# Patient Record
Sex: Female | Born: 1960 | Race: Black or African American | Hispanic: No | Marital: Married | Smoking: Never smoker
Health system: Southern US, Community
[De-identification: ages and names within clinical notes are randomized; demographics above are authoritative.]

## PROBLEM LIST (undated history)

## (undated) DIAGNOSIS — I10 Essential (primary) hypertension: Secondary | ICD-10-CM

## (undated) HISTORY — DX: Essential (primary) hypertension: I10

---

## 2005-09-19 ENCOUNTER — Ambulatory Visit (HOSPITAL_COMMUNITY): Admission: RE | Admit: 2005-09-19 | Discharge: 2005-09-19 | Payer: Self-pay | Admitting: *Deleted

## 2005-10-17 ENCOUNTER — Other Ambulatory Visit: Admission: RE | Admit: 2005-10-17 | Discharge: 2005-10-17 | Payer: Self-pay | Admitting: Obstetrics and Gynecology

## 2005-10-19 ENCOUNTER — Encounter: Admission: RE | Admit: 2005-10-19 | Discharge: 2005-10-19 | Payer: Self-pay | Admitting: Obstetrics and Gynecology

## 2005-11-17 ENCOUNTER — Encounter: Admission: RE | Admit: 2005-11-17 | Discharge: 2005-11-17 | Payer: Self-pay | Admitting: Obstetrics and Gynecology

## 2008-07-22 ENCOUNTER — Other Ambulatory Visit: Admission: RE | Admit: 2008-07-22 | Discharge: 2008-07-22 | Payer: Self-pay | Admitting: Family Medicine

## 2010-01-23 ENCOUNTER — Encounter: Payer: Self-pay | Admitting: Obstetrics and Gynecology

## 2010-04-14 ENCOUNTER — Other Ambulatory Visit (HOSPITAL_BASED_OUTPATIENT_CLINIC_OR_DEPARTMENT_OTHER): Payer: Self-pay | Admitting: Family Medicine

## 2010-04-15 ENCOUNTER — Emergency Department (HOSPITAL_BASED_OUTPATIENT_CLINIC_OR_DEPARTMENT_OTHER)
Admission: EM | Admit: 2010-04-15 | Discharge: 2010-04-15 | Disposition: A | Discharge status: Nursing Home (Includes ICF) | Payer: Federal, State, Local not specified - PPO | Source: Home / Self Care | Attending: Emergency Medicine | Admitting: Emergency Medicine

## 2010-04-15 ENCOUNTER — Inpatient Hospital Stay (HOSPITAL_COMMUNITY): Payer: Federal, State, Local not specified - PPO

## 2010-04-15 ENCOUNTER — Inpatient Hospital Stay (HOSPITAL_COMMUNITY)
Admission: AD | Admit: 2010-04-15 | Discharge: 2010-04-17 | DRG: 493 | Disposition: A | Discharge status: Home / Self Care | Payer: Federal, State, Local not specified - PPO | Source: Other Acute Inpatient Hospital | Attending: General Surgery | Admitting: General Surgery

## 2010-04-15 ENCOUNTER — Ambulatory Visit (INDEPENDENT_AMBULATORY_CARE_PROVIDER_SITE_OTHER)
Admission: RE | Admit: 2010-04-15 | Discharge: 2010-04-15 | Disposition: A | Discharge status: Home / Self Care | Payer: Federal, State, Local not specified - PPO | Source: Ambulatory Visit | Attending: Family Medicine | Admitting: Family Medicine

## 2010-04-15 DIAGNOSIS — K810 Acute cholecystitis: Principal | ICD-10-CM | POA: Diagnosis present

## 2010-04-15 DIAGNOSIS — K8010 Calculus of gallbladder with chronic cholecystitis without obstruction: Secondary | ICD-10-CM | POA: Insufficient documentation

## 2010-04-15 DIAGNOSIS — I10 Essential (primary) hypertension: Secondary | ICD-10-CM | POA: Insufficient documentation

## 2010-04-15 DIAGNOSIS — K8020 Calculus of gallbladder without cholecystitis without obstruction: Secondary | ICD-10-CM | POA: Insufficient documentation

## 2010-04-15 DIAGNOSIS — R109 Unspecified abdominal pain: Secondary | ICD-10-CM

## 2010-04-15 DIAGNOSIS — E876 Hypokalemia: Secondary | ICD-10-CM | POA: Diagnosis present

## 2010-04-15 DIAGNOSIS — R748 Abnormal levels of other serum enzymes: Secondary | ICD-10-CM | POA: Insufficient documentation

## 2010-04-15 DIAGNOSIS — R1011 Right upper quadrant pain: Secondary | ICD-10-CM | POA: Insufficient documentation

## 2010-04-15 LAB — CBC
MCHC: 32.9 g/dL (ref 30.0–36.0)
Platelets: 318 10*3/uL (ref 150–400)
RDW: 14.5 % (ref 11.5–15.5)

## 2010-04-15 LAB — COMPREHENSIVE METABOLIC PANEL
ALT: 63 U/L — ABNORMAL HIGH (ref 0–35)
AST: 57 U/L — ABNORMAL HIGH (ref 0–37)
Calcium: 8.5 mg/dL (ref 8.4–10.5)
Creatinine, Ser: 1.1 mg/dL (ref 0.4–1.2)
GFR calc Af Amer: >60 mL/min (ref >60)
Sodium: 141 mEq/L (ref 135–145)
Total Protein: 7.7 g/dL (ref 6.0–8.3)

## 2010-04-15 LAB — DIFFERENTIAL
Basophils Absolute: 0 10*3/uL (ref 0.0–0.1)
Basophils Relative: 0 % (ref 0–1)
Eosinophils Absolute: 0.1 10*3/uL (ref 0.0–0.7)
Eosinophils Relative: 1 % (ref 0–5)
Monocytes Absolute: 1 10*3/uL (ref 0.1–1.0)
Neutro Abs: 7 10*3/uL (ref 1.7–7.7)

## 2010-04-16 ENCOUNTER — Other Ambulatory Visit: Payer: Self-pay | Admitting: General Surgery

## 2010-04-16 ENCOUNTER — Inpatient Hospital Stay (HOSPITAL_COMMUNITY): Payer: Federal, State, Local not specified - PPO

## 2010-04-16 LAB — CBC
HCT: 31.8 % — ABNORMAL LOW (ref 36.0–46.0)
MCH: 26.8 pg (ref 26.0–34.0)
MCV: 82.6 fL (ref 78.0–100.0)
Platelets: 320 10*3/uL (ref 150–400)
RBC: 3.85 MIL/uL — ABNORMAL LOW (ref 3.87–5.11)
WBC: 8.8 10*3/uL (ref 4.0–10.5)

## 2010-04-16 LAB — URINE CULTURE
Colony Count: NO GROWTH
Culture  Setup Time: 201204121820
Culture: NO GROWTH

## 2010-04-16 LAB — COMPREHENSIVE METABOLIC PANEL
ALT: 59 U/L — ABNORMAL HIGH (ref 0–35)
AST: 67 U/L — ABNORMAL HIGH (ref 0–37)
Alkaline Phosphatase: 63 U/L (ref 39–117)
CO2: 25 mEq/L (ref 19–32)
GFR calc Af Amer: >60 mL/min (ref >60)
Glucose, Bld: 147 mg/dL — ABNORMAL HIGH (ref 70–99)
Potassium: 4.2 mEq/L (ref 3.5–5.1)
Sodium: 134 mEq/L — ABNORMAL LOW (ref 135–145)
Total Protein: 5.7 g/dL — ABNORMAL LOW (ref 6.0–8.3)

## 2010-04-16 LAB — MAGNESIUM: Magnesium: 2.5 mg/dL (ref 1.5–2.5)

## 2010-04-29 NOTE — H&P (Signed)
NAMEMERRANDA, Stacy Terry                 ACCOUNT NO.:  [id number]  MEDICAL RECORD NO.:  [id number]           PATIENT TYPE:  I  LOCATION:  5158                         FACILITY:  [facility]  PHYSICIAN:  Adolph Pollack, M.D.DATE OF BIRTH:  22-Jan-1960  DATE OF ADMISSION:  04/15/2010 DATE OF DISCHARGE:                             HISTORY & PHYSICAL   REFERRING PHYSICIAN:  Dr. Preston Fleeting, ER.  PRIMARY CARE:  [facility].  French Ana is her PA.  CHIEF COMPLAINT:  Abdominal pain.  BRIEF HISTORY:  The patient is a 50 year old African American female who developed abdominal pain, nausea and vomiting on Monday.  She had pain throughout the whole day and probably a low grade fever.  She called her primary care and they sent her to the [facility] for evaluation because they were full.  There, she was seen and evaluated initially, treated with Nexium.  The following day, she was seen by her primary care at [facility].  They did blood and urine studies and it was their opinion she probably had cholelithiasis and was sent to [facility] facility for an outpatient ultrasound.  After the findings were obtained, they were called to the primary care and she was sent to the emergency department there at [facility] for evaluation and treatment.  She reports on Tuesday, she had pain, but no further nausea or vomiting.  Wednesday, she also had pain, but no nausea or vomiting.  She had been eating small portions and currently her pain is better unless she push on her abdomen.  Currently, she is feeling no pain at all and she was treated at [facility] with Dilaudid, Zofran. She had one dose of Cipro and one run of IV potassium.  PAST MEDICAL HISTORY: 1. Hypertension. 2. Heart murmur, congenitals.  She thinks it is resolved.  No one said     anything about it recently. 3. Lichen planus.  PAST SURGICAL HISTORY:  None.  FAMILY HISTORY:  Both parents  are deceased.  Father died at 40 with high blood pressure.  Mother died at 41 with alcohol abuse and high blood pressure.  She has 4 brothers; the oldest has diabetes, high blood pressure; the second brother has cancer, hypertension, asthma; third brother has diabetes, hypertension, is on dialysis and has a history of drug abuse; her fourth brother has diabetes and hypertension.  SOCIAL HISTORY:  Tobacco:  None.  Drugs:  None.  Alcohol, she has an occasional glass of wine.  She is a post Electrical engineer.  REVIEW OF SYSTEMS:  GENERAL:  Fevers positive.  Her fever was up at the initial facility today [facility] and on admission here, she is not sure, but thinks she may have had fever prior.  SKIN:  No changes. WEIGHT:  No changes.  PSYCH:  No changes.  CV:  No headaches, dizziness, syncope or stroke.  PULMONARY:  Negative.  CARDIAC:  Negative.  GI: Negative for GERD.  No nausea or vomiting prior to Monday.  No diarrhea, constipation, blood in her stool.  GU:  Negative.  LOWER EXTREMITIES: Negative.  No edema or claudication.  MUSCULOSKELETAL:  No joint changes.  ENDOCRINE:  Negative.  CURRENT MEDICATIONS:  Diovan 80/12.5 one daily.  ALLERGIES:  None known.  PHYSICAL EXAM:  VITAL SIGNS:  At [facility], her temperature on admission was 99.1, respiratory rate was 18, heart rate was 92, blood pressure was 116/72.  Currently, her temperature is 100, heart rate is 83, blood pressure is 117/73, respiratory rate is 18, sats are 100% on room air. GENERAL:  This is a well-nourished, well-developed [place] American female, in no acute distress. HEENT:  Head:  Normocephalic.  Eyes, ears, nose and throat are all within normal limits. NECK:  Trachea is in the midline.  Thyroid is nonpalpable.  No JVD.  No bruits. CHEST:  Clear to auscultation.  Chest was nontender. CARDIAC:  She has a soft 1-2/6 systolic murmur heard both at the aortic and pulmonary positions at the base. ABDOMEN:  Soft.   She is not distended.  She is tender in her right upper quadrant with palpation.  No palpable hepatosplenomegaly.  No hernias, masses or abscesses. GU/RECTAL:  Deferred.  Lymphadenopathy is negative. MUSCULOSKELETAL:  No changes. SKIN:  No changes. NEUROLOGIC:  No changes.  Cranial nerves are grossly normal.  There are no focal changes. PSYCH:  Normal affect.  LABS:  Lipase 146.  CMP shows a sodium of 141, potassium of 4.9, chloride of 97, CO2 of 32, BUN of 12, creatinine of 1.1, total bilirubin is 0.8, alk phos is 97, SGOT is 57, SGPT is 63.  White count is 9000, hemoglobin is 12.1, hematocrit 36.8, platelets are 318,000.  Abdominal ultrasound shows a 4.1 cm stone in the neck of the gallbladder that is 7 mm thickened wall.  She also has a sludge in the gallbladder.  There is mild pericholecystic fluid, bile duct is 6 mm.  Everything else was normal.  IMPRESSION: 1. Cholelithiasis with acute cholecystitis. 2. Hypertension. 3. History of murmur. 4. Hypokalemia.  PLAN:  We are going to reschedule her in the morning.  We will replace her potassium.  She is started on Cipro.  We are going to switch her over to Zosyn.  We will give her magnesium tonight, put her on clear liquids, scheduled for cholecystectomy in the a.m.     Eber Hong, P.A.   ______________________________ Adolph Pollack, M.D.    WDJ/MEDQ  D:  04/15/2010  T:  04/15/2010  Job:  [id number]  cc:   [facility]  Electronically Signed by Sherrie George P.A. on 04/23/2010 09:38:19 PM Electronically Signed by Avel Peace M.D. on 04/29/2010 01:25:08 PM

## 2010-04-29 NOTE — Op Note (Signed)
Stacy Terry, BOSAK                 ACCOUNT NO.:  [id number]  MEDICAL RECORD NO.:  [id number]           PATIENT TYPE:  I  LOCATION:  5158                         FACILITY:  [facility]  PHYSICIAN:  Adolph Pollack, M.D.DATE OF BIRTH:  05/11/1960  DATE OF PROCEDURE:  04/16/2010 DATE OF DISCHARGE:                              OPERATIVE REPORT   PREOPERATIVE DIAGNOSIS:  Acute cholecystitis.  POSTOPERATIVE DIAGNOSIS:  Acute cholecystitis.  PROCEDURE:  Laparoscopic cholecystectomy with cholangiogram.  SURGEON:  Adolph Pollack, M.D.  ASSISTANT:  Brayton El, PA-C  ANESTHESIA:  General.  INDICATIONS:  Ms. Etzkorn is a 50 year old female who presented to [facility] with epigastric and right upper quadrant pain and was discovered to have acute cholecystitis.  She was transferred to [facility] yesterday late afternoon and she was started on IV antibiotics.  She is now brought to the operating room for a laparoscopic cholecystectomy.  Procedure risks and aftercare were discussed with her preoperatively.  TECHNIQUE:  She was brought to the operating room, placed supine on the operating table, and general anesthetic was administered.  Her abdominal wall was then widely sterilely prepped and draped.  In the subumbilical region, Marcaine solution was infiltrated.  A small subumbilical incision was made through the skin, subcutaneous tissue, fascia, and peritoneum entering the peritoneal cavity under direct vision.  A pursestring suture of 0 Vicryl was placed around the fascial edges.  A Hasson trocar was introduced in the peritoneal cavity.  Pneumoperitoneum was created by insufflation of CO2 gas.  Next, a laparoscope was introduced and there was no underlying bleeding or organ injury.  An acutely inflamed distended gallbladder was noted in the right upper quadrant with some omental adhesions.  A 5-mm trocar was placed through epigastric incision and two 5-mm trocars  were placed in the right mid lateral abdomen.  She was placed in a reverse Trendelenburg position, the right side tilted slightly down.  Needle decompression was done of the gallbladder, emptying some thick purulent- appearing bile.  Fundus of the gallbladder was grasped and retracted toward the right shoulder.  Using blunt dissection, I dissected omental adhesions free from the body and infundibulum and then using blunt dissection close to the gallbladder, I mobilized the infundibulum and retracted it laterally.  Using blunt dissection, I identified the cystic duct and created a window around it.  An anterior branch of the cystic artery was also identified.  A window was created around the anterior branch.  The cystic artery was clipped and divided, giving me the critical view.  Following this, I placed a clip to the cystic duct gallbladder junction and made a small incision in the cystic duct.  A cholangiocatheter was passed through the anterior abdominal wall, placed into the cystic duct and a cholangiogram was performed.  A real time fluoroscopy dilute contrast was injected into the cystic duct which was of moderate-to-long length.  There was some small amount of extravasation of contrast around the catheter insertion site.  Common hepatic, right and left hepatic, and common bile ducts all filled. Contrast drained promptly into the duodenum without obvious  evidence of obstruction.  Final reports pending the radiologist interpretation.  Following this, I removed the cholangiocatheter, the cystic duct was clipped 3 times on the biliary side and divided.  I then identified a posterior branch of the cystic artery, clipped and divided close to the gallbladder after creating a window around it.  The gallbladder was then dissected free from liver using electrocautery.  The gallbladder fossa was then copiously irrigated and bleeding point was controlled with electrocautery.  The  gallbladder was placed in an Endopouch bag.  I had to enlarge the subumbilical incision to remove the gallbladder which was removed somewhat piecemeal and I also removed a large stone.  Once the gallbladder was removed, I then replaced the Hasson trocar.  I copiously irrigated out the perihepatic area with sterile saline and evacuated as much of the irrigation fluid as possible.  A piece of Surgicel was then placed in the gallbladder fossa.  I then removed the Hasson trocar and closed the subumbilical fascial defect by tightening up and tightening down the pursestring suture and placing extra 0 Vicryl sutures in the superior aspect of the fascial defect.  I then released the CO2 gas and removed the trocars.  Skin incisions were closed with 4-0 Monocryl subcuticular stitches. Steri-Strips and sterile dressings were applied.  She tolerated the procedure without any apparent complications.  She was taken to recovery room in satisfactory condition.     Adolph Pollack, M.D.     Kari Baars  D:  04/16/2010  T:  04/17/2010  Job:  [id number]  Electronically Signed by Avel Peace M.D. on 04/29/2010 01:25:31 PM

## 2010-05-06 NOTE — Discharge Summary (Signed)
  NAMEANALYNN, DAUM                 ACCOUNT NO.:  [id number]  MEDICAL RECORD NO.:  [id number]           PATIENT TYPE:  I  LOCATION:  5158                         FACILITY:  [facility]  PHYSICIAN:  Adolph Pollack, M.D.DATE OF BIRTH:  [birth year]  DATE OF ADMISSION:  04/15/2010 DATE OF DISCHARGE:  04/17/2010                              DISCHARGE SUMMARY   FINAL DISCHARGE DIAGNOSIS:  Acute cholecystitis.  SECONDARY DIAGNOSIS:  Hypertension.  PROCEDURE:  Laparoscopic cholecystectomy with cholangiogram April 16, 2010.  REASON FOR ADMISSION:  Ms. Sperry is a 51 year old female who presented to the [facility] with epigastric and right upper quadrant pain and was discovered to have acute cholecystitis.  She was subsequently transferred to the [facility], was admitted, and started on IV antibiotics, plans for cholecystectomy.  HOSPITAL COURSE:  She underwent laparoscopic cholecystectomy April 16, 2010 which she tolerated well.  Findings were consistent with acute cholecystitis.  She did well on her first postoperative day, was afebrile that morning, tolerating her soft diet, was voiding, and was able to be discharged.  DISPOSITION:  Discharged to home in satisfactory condition April 16, 2008.  She was given discharge instructions and pain medication and will resume her home medicines.  She will follow up in the office in 2-3 weeks and was told to call if she had any problems.     Adolph Pollack, M.D.     Kari Baars  D:  05/05/2010  T:  05/05/2010  Job:  [id number]  Electronically Signed by Avel Peace M.D. on 05/06/2010 10:01:57 AM

## 2010-11-17 ENCOUNTER — Other Ambulatory Visit: Payer: Self-pay | Admitting: Dermatology

## 2012-01-04 HISTORY — PX: CHOLECYSTECTOMY: SHX55

## 2012-01-23 ENCOUNTER — Other Ambulatory Visit: Payer: Self-pay | Admitting: Family Medicine

## 2012-01-23 DIAGNOSIS — Z1231 Encounter for screening mammogram for malignant neoplasm of breast: Secondary | ICD-10-CM

## 2012-02-22 ENCOUNTER — Ambulatory Visit: Payer: Federal, State, Local not specified - PPO

## 2013-12-11 ENCOUNTER — Other Ambulatory Visit: Payer: Self-pay | Admitting: Family Medicine

## 2013-12-11 DIAGNOSIS — Z1231 Encounter for screening mammogram for malignant neoplasm of breast: Secondary | ICD-10-CM

## 2013-12-25 ENCOUNTER — Encounter (INDEPENDENT_AMBULATORY_CARE_PROVIDER_SITE_OTHER): Payer: Self-pay

## 2013-12-25 ENCOUNTER — Ambulatory Visit
Admission: RE | Admit: 2013-12-25 | Discharge: 2013-12-25 | Disposition: A | Discharge status: Home / Self Care | Payer: Federal, State, Local not specified - PPO | Source: Ambulatory Visit | Attending: Family Medicine | Admitting: Family Medicine

## 2013-12-25 DIAGNOSIS — Z1231 Encounter for screening mammogram for malignant neoplasm of breast: Secondary | ICD-10-CM

## 2014-08-21 ENCOUNTER — Encounter: Payer: Self-pay | Admitting: Podiatry

## 2014-08-21 ENCOUNTER — Ambulatory Visit (INDEPENDENT_AMBULATORY_CARE_PROVIDER_SITE_OTHER): Payer: Federal, State, Local not specified - PPO | Admitting: Podiatry

## 2014-08-21 VITALS — BP 137/79 | HR 51 | Resp 16 | Ht 66.0 in | Wt 152.0 lb

## 2014-08-21 DIAGNOSIS — L608 Other nail disorders: Secondary | ICD-10-CM | POA: Diagnosis not present

## 2014-08-21 DIAGNOSIS — M2042 Other hammer toe(s) (acquired), left foot: Secondary | ICD-10-CM

## 2014-08-21 DIAGNOSIS — B351 Tinea unguium: Secondary | ICD-10-CM | POA: Diagnosis not present

## 2014-08-21 MED ORDER — TERBINAFINE HCL 250 MG PO TABS: 250.0000 mg | ORAL_TABLET | Freq: Every day | ORAL | Status: DC

## 2014-08-21 NOTE — Progress Notes (Signed)
   Subjective:    Patient ID: Stacy Terry, female    DOB: 05/27/1960, 54 y.o.   MRN: [id number]  HPI Patient presents with bilateral nail problem; discoloration; great toes. Right big toe is worse. This has been going on for past 2 months. Pt tried perioxide with no relief.   Review of Systems  Skin: Positive for color change.  Neurological: Positive for tremors.  All other systems reviewed and are negative.      Objective:   Physical Exam        Assessment & Plan:

## 2014-08-24 NOTE — Progress Notes (Signed)
Subjective:     Patient ID: Stacy Terry, female   DOB: [birth year], 54 y.o.   MRN: [id number]  HPI patient presents with nail disease of the hallux bilateral with thickness yellow brittle disease and is concerned about spreading and if anything can be done to help her   Review of Systems  All other systems reviewed and are negative.      Objective:   Physical Exam  Constitutional: She is oriented to person, place, and time.  Cardiovascular: Intact distal pulses.   Musculoskeletal: Normal range of motion.  Neurological: She is oriented to person, place, and time.  Skin: Skin is warm.  Nursing note and vitals reviewed.  neurovascular status is intact with muscle strength adequate range of motion within normal limits. Patient's noted to have nail thickness and yellow brittle discoloration with odor noted of the localized nature and mild infection of the skin plantar that's localized. 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 At this point I have recommended oral terbinafine to be taken for 90 days and we are going to get liver function studies done and I did a biopsy of the nailbed to better understand the underlying pathology. I then discussed formula 3 which was dispensed and the possibility for future laser if symptoms continue

## 2014-09-11 ENCOUNTER — Telehealth: Payer: Self-pay | Admitting: *Deleted

## 2014-09-11 DIAGNOSIS — Z79899 Other long term (current) drug therapy: Secondary | ICD-10-CM

## 2014-09-11 NOTE — Telephone Encounter (Addendum)
Left message requesting pt call concerning lab results.  Dr. Charlsie Merles states will have pt perform Hepatic function test, fungal culture is +, then if blood work is normal may begin Lamisil.  Informed pt of Dr. Beverlee Nims recommendation that if + fungal culture should have hepatic function test.  Pt agreed and orders sent to [facility].

## 2014-10-19 ENCOUNTER — Other Ambulatory Visit: Payer: Self-pay | Admitting: Podiatry

## 2014-10-20 ENCOUNTER — Telehealth: Payer: Self-pay | Admitting: *Deleted

## 2014-10-21 NOTE — Telephone Encounter (Signed)
Entered in error

## 2014-11-21 ENCOUNTER — Encounter: Payer: Self-pay | Admitting: Podiatry

## 2014-12-23 ENCOUNTER — Other Ambulatory Visit: Payer: Self-pay | Admitting: Obstetrics & Gynecology

## 2014-12-23 ENCOUNTER — Other Ambulatory Visit (HOSPITAL_COMMUNITY)
Admission: RE | Admit: 2014-12-23 | Discharge: 2014-12-23 | Disposition: A | Discharge status: Home / Self Care | Payer: Federal, State, Local not specified - PPO | Source: Ambulatory Visit | Attending: Obstetrics & Gynecology | Admitting: Obstetrics & Gynecology

## 2014-12-23 DIAGNOSIS — Z1151 Encounter for screening for human papillomavirus (HPV): Secondary | ICD-10-CM | POA: Diagnosis not present

## 2014-12-23 DIAGNOSIS — Z01419 Encounter for gynecological examination (general) (routine) without abnormal findings: Secondary | ICD-10-CM | POA: Insufficient documentation

## 2014-12-24 LAB — CYTOLOGY - PAP

## 2015-01-09 ENCOUNTER — Ambulatory Visit: Payer: Federal, State, Local not specified - PPO | Admitting: Neurology

## 2015-01-13 ENCOUNTER — Encounter: Payer: Self-pay | Admitting: Neurology

## 2015-01-16 ENCOUNTER — Telehealth: Payer: Self-pay | Admitting: Neurology

## 2015-01-16 ENCOUNTER — Encounter (INDEPENDENT_AMBULATORY_CARE_PROVIDER_SITE_OTHER): Payer: Self-pay

## 2015-01-16 ENCOUNTER — Ambulatory Visit (INDEPENDENT_AMBULATORY_CARE_PROVIDER_SITE_OTHER): Payer: Federal, State, Local not specified - PPO | Admitting: Neurology

## 2015-01-16 ENCOUNTER — Ambulatory Visit (INDEPENDENT_AMBULATORY_CARE_PROVIDER_SITE_OTHER): Payer: Self-pay | Admitting: Neurology

## 2015-01-16 ENCOUNTER — Encounter: Payer: Self-pay | Admitting: Neurology

## 2015-01-16 VITALS — BP 130/83 | HR 76 | Resp 20 | Ht 66.0 in | Wt 145.0 lb

## 2015-01-16 VITALS — BP 130/83 | HR 76 | Ht 66.0 in | Wt 145.2 lb

## 2015-01-16 DIAGNOSIS — G243 Spasmodic torticollis: Secondary | ICD-10-CM | POA: Diagnosis not present

## 2015-01-16 NOTE — Telephone Encounter (Signed)
Met with patient scheduled injection, waiting on botox packet. °

## 2015-01-16 NOTE — Progress Notes (Deleted)
  [facility]    Provider:  Dr Lucia GaskinsAhern Referring Provider: Swazil[place]Jordan, Betty G, MD Primary Care Physician:  SwazilandJORDAN, BETTY G, MD  CC:  ***  HPI:  Stacy FreestoneDawn A Terry is a 55 y.o. female here as a referral from Dr. SwazilandJordan for ***.  ***  Reviewed notes, labs and imaging from outside physicians, which showed ***  Review of Systems: Patient complains of symptoms per HPI as well as the following symptoms ***. Pertinent negatives per HPI. All others negative.   Social History   Social History  . Marital Status: Married    Spouse Name: Baldo AshCarl  . Number of Children: 3  . Years of Education: 12+   Occupational History  . Retired    Social History Main Topics  . Smoking status: Never Smoker   . Smokeless tobacco: Not on file  . Alcohol Use: 0.0 oz/week    0 Standard drinks or equivalent per week     Comment: 2 glasses wine- ocass  . Drug Use: No  . Sexual Activity: Not on file   Other Topics Concern  . Not on file   Social History Narrative   Lives with son   Caffeine use: Green tea- ocass    Family History  Problem Relation Age of Onset  . Hypertension    . Diabetes      Past Medical History  Diagnosis Date  . Hypertension     Past Surgical History  Procedure Laterality Date  . Cholecystectomy  2014    Current Outpatient Prescriptions  Medication Sig Dispense Refill  . valsartan-hydrochlorothiazide (DIOVAN-HCT) 80-12.5 MG tablet TAKE 1 TABLET ONCE A DAY ORALLY 30 DAYS  5  . vitamin B-12 (CYANOCOBALAMIN) 1000 MCG tablet Take 1,000 mcg by mouth daily.    . primidone (MYSOLINE) 50 MG tablet Take 50 mg by mouth. Reported on 01/16/2015  2  . valsartan (DIOVAN) 80 MG tablet Reported on 01/16/2015  5   No current facility-administered medications for this visit.    Allergies as of 01/16/2015  . (No Known Allergies)    Vitals: BP 130/83 mmHg  Pulse 76  Ht 5\' 6"  (1.676 m)  Wt 145 lb 3.2 oz (65.862 kg)  BMI 23.45 kg/m2 Last Weight:  Wt Readings from  Last 1 Encounters:  01/16/15 145 lb 3.2 oz (65.862 kg)   Last Height:   Ht Readings from Last 1 Encounters:  01/16/15 5\' 6"  (1.676 m)         Assessment/Plan:    Naomie DeanAntonia Jorah Hua, MD  [facility] [address] [place], [place] [id number]  Phone [phone] Fax [phone]  A total of *** minutes was spent face-to-face with this patient. Over half this time was spent on counseling patient on the *** diagnosis and different diagnostic and therapeutic options available.

## 2015-01-16 NOTE — Patient Instructions (Addendum)
Cervical dystonia:   FindLeather.com.auhttp://www.mayoclinic.org/diseases-conditions/spasmodic-torticollis/home/ovc-20260698     How does botulism toxin injection Hopp cervical dystonia,  JuniorAgent.[email or web address].botoxcervicaldystonia.com/treatment injur isy

## 2015-01-16 NOTE — Progress Notes (Signed)
PATIENT: Stacy Terry A Whitham DOB: April 05, 1960  Chief Complaint  Patient presents with  . New Patient (Initial Visit)  . Tremors    head tremors, was given primidone but did not take it, wants to discuss wihether it is essential tremors or not, rm 5, alone     HISTORICAL  Stacy Terry is a 55 year old right-handed female, seen in refer by her primary care physician Dr. Betty SwazilandJordan for evaluation of neck achy pain, had shaking, abnormal neck posturing.  She has past medical history of hypertension.  Around age [age], she noticed mild head intermittent shaking, gradually getting worse over the past decade, she now has frequent quick jerking movement of her neck either to the left or to the right, she also feel constant pulling sensation to her left shoulder, and towards her chest, she felt constant neck achy pain, right shoulder tightness,  She recently retired from a desk job, she denied visual difficulty, denied gait difficulty, there was no clear history of neck trauma.  Laboratory in December 17 2014 reviewed, almost CMP, negative RPR, B12 248  Review of SYSTEMS: Full 14 system review of systems performed and notable only for tremor, anxiety  ALLERGIES: No Known Allergies  HOME MEDICATIONS: Current Outpatient Prescriptions  Medication Sig Dispense Refill  . primidone (MYSOLINE) 50 MG tablet Take 50 mg by mouth. Reported on 01/16/2015  2  . valsartan (DIOVAN) 80 MG tablet Reported on 01/16/2015  5  . valsartan-hydrochlorothiazide (DIOVAN-HCT) 80-12.5 MG tablet TAKE 1 TABLET ONCE A DAY ORALLY 30 DAYS  5  . vitamin B-12 (CYANOCOBALAMIN) 1000 MCG tablet Take 1,000 mcg by mouth daily.     No current facility-administered medications for this visit.    PAST MEDICAL HISTORY: Past Medical History  Diagnosis Date  . Hypertension     PAST SURGICAL HISTORY: Past Surgical History  Procedure Laterality Date  . Cholecystectomy  2014    FAMILY HISTORY: Family History  Problem  Relation Age of Onset  . Hypertension    . Diabetes      SOCIAL HISTORY:  Social History   Social History  . Marital Status: Married    Spouse Name: Baldo AshCarl  . Number of Children: 3  . Years of Education: 12+   Occupational History  . Retired    Social History Main Topics  . Smoking status: Never Smoker   . Smokeless tobacco: Not on file  . Alcohol Use: 0.0 oz/week    0 Standard drinks or equivalent per week     Comment: 2 glasses wine- ocass  . Drug Use: No  . Sexual Activity: Not on file   Other Topics Concern  . Not on file   Social History Narrative   Lives with son   Caffeine use: Green tea- ocass     PHYSICAL EXAM   Filed Vitals:   01/16/15 1236  BP: 130/83  Pulse: 76  Resp: 20  Height: 5\' 6"  (1.676 m)  Weight: 145 lb (65.772 kg)    Not recorded      Body mass index is 23.41 kg/(m^2).  PHYSICAL EXAMNIATION:  Gen: NAD, conversant, well nourised, obese, well groomed                     Cardiovascular: Regular rate rhythm, no peripheral edema, warm, nontender. Eyes: Conjunctivae clear without exudates or hemorrhage Neck: Supple, no carotid bruise. Pulmonary: Clear to auscultation bilaterally   NEUROLOGICAL EXAM:  MENTAL STATUS: Speech:    Speech is  normal; fluent and spontaneous with normal comprehension.  Cognition:     Orientation to time, place and person     Normal recent and remote memory     Normal Attention span and concentration     Normal Language, naming, repeating,spontaneous speech     Fund of knowledge   CRANIAL NERVES: CN II: Visual fields are full to confrontation. Fundoscopic exam is normal with sharp discs and no vascular changes. Pupils are round equal and briskly reactive to light. CN III, IV, VI: extraocular movement are normal. No ptosis. CN V: Facial sensation is intact to pinprick in all 3 divisions bilaterally. Corneal responses are intact.  CN VII: Face is symmetric with normal eye closure and smile. CN VIII:  Hearing is normal to rubbing fingers CN IX, X: Palate elevates symmetrically. Phonation is normal. CN XI: Head turning and shoulder shrug are intact CN XII: Tongue is midline with normal movements and no atrophy.  MOTOR: She has moderate right shoulder elevation, mild anterocollis, moderate left tilt, moderate right turn, occasionally no-no head shaking, full range of motion There is no pronator drift of out-stretched arms. Muscle bulk and tone are normal. Muscle strength is normal.  REFLEXES: Reflexes are 2+ and symmetric at the biceps, triceps, knees, and ankles. Plantar responses are flexor.  SENSORY: Intact to light touch, pinprick, position sense, and vibration sense are intact in fingers and toes.  COORDINATION: Rapid alternating movements and fine finger movements are intact. There is no dysmetria on finger-to-nose and heel-knee-shin.    GAIT/STANCE: Posture is normal. Gait is steady with normal steps, base, arm swing, and turning. Heel and toe walking are normal. Tandem gait is normal.  Romberg is absent.   DIAGNOSTIC DATA (LABS, IMAGING, TESTING) - I reviewed patient records, labs, notes, testing and imaging myself where available.   ASSESSMENT AND PLAN  Stacy Terry is a 55 y.o. female    Cervical dystonia  She has moderate anterocollis, right shoulder elevation, left tilt, right turn, occasionally no-no head shaking, good range of motion  Complete evaluation with MRI of cervical spine  Preauthorization process for xeomin  Return to clinic for EMG guided xeomin injection  Also provide information for neck stretching exercise, hot compression of her neck and shoulder   Levert Feinstein, M.D. Ph.D.  [facility] [address] [place], [place] [address] Ph: [phone] Fax: [phone]  CC: Dr. Betty Swaziland

## 2015-01-18 NOTE — Progress Notes (Signed)
Left without being seen.

## 2015-02-25 ENCOUNTER — Telehealth: Payer: Self-pay | Admitting: Neurology

## 2015-02-25 NOTE — Telephone Encounter (Signed)
Called patient to inform her that the pharmacy is waiting for her consent to ship medication. She needs to call [phone].

## 2015-02-26 ENCOUNTER — Encounter: Payer: Self-pay | Admitting: Neurology

## 2015-02-26 ENCOUNTER — Ambulatory Visit (INDEPENDENT_AMBULATORY_CARE_PROVIDER_SITE_OTHER): Payer: Federal, State, Local not specified - PPO | Admitting: Neurology

## 2015-02-26 VITALS — BP 112/74 | HR 58 | Ht 66.0 in | Wt 145.0 lb

## 2015-02-26 DIAGNOSIS — F419 Anxiety disorder, unspecified: Secondary | ICD-10-CM | POA: Insufficient documentation

## 2015-02-26 MED ORDER — VENLAFAXINE HCL ER 37.5 MG PO CP24: 37.5000 mg | ORAL_CAPSULE | Freq: Every day | ORAL | Status: AC

## 2015-02-26 NOTE — Progress Notes (Signed)
Chief Complaint  Patient presents with  . Cervical Dystonia    She has been stretching and doing yoga.  She would like to further discuss treatment with Xeomin.      PATIENT: Stacy Terry DOB: 1960-10-25  Chief Complaint  Patient presents with  . Cervical Dystonia    She has been stretching and doing yoga.  She would like to further discuss treatment with Xeomin.     HISTORICAL  Stacy Terry is a 55 year old right-handed female, seen in refer by her primary care physician Dr. Betty Swaziland for evaluation of neck achy pain, had shaking, abnormal neck posturing.  She has past medical history of hypertension.  Around age [age], she noticed mild head intermittent shaking, gradually getting worse over the past decade, she now has frequent quick jerking movement of her neck either to the left or to the right, she also feel constant pulling sensation to her left shoulder, and towards her chest, she felt constant neck achy pain, right shoulder tightness,  She recently retired from a desk job, she denied visual difficulty, denied gait difficulty, there was no clear history of neck trauma.  Laboratory in December 17 2014 reviewed, almost CMP, negative RPR, B12 [id number]  Update February 26 2015: Patient is approved for EMG guided xeomin injection for her cervical dystonia, however she has concern about her diagnosis, and potential side effect with filmy injection, she also complains of anxiety symptoms, wants to have further understanding of her condition before proceed with EMG guided injection.  Review of SYSTEMS: Full 14 system review of systems performed and notable only for tremor, anxiety  ALLERGIES: No Known Allergies  HOME MEDICATIONS: Current Outpatient Prescriptions  Medication Sig Dispense Refill  . valsartan-hydrochlorothiazide (DIOVAN-HCT) 80-12.5 MG tablet TAKE 1 TABLET ONCE A DAY ORALLY 30 DAYS  5  . vitamin B-12 (CYANOCOBALAMIN) 1000 MCG tablet Take 1,000 mcg by mouth  daily.     No current facility-administered medications for this visit.    PAST MEDICAL HISTORY: Past Medical History  Diagnosis Date  . Hypertension     PAST SURGICAL HISTORY: Past Surgical History  Procedure Laterality Date  . Cholecystectomy  2014    FAMILY HISTORY: Family History  Problem Relation Age of Onset  . Hypertension    . Diabetes      SOCIAL HISTORY:  Social History   Social History  . Marital Status: Married    Spouse Name: Baldo Ash  . Number of Children: 3  . Years of Education: 12+   Occupational History  . Retired    Social History Main Topics  . Smoking status: Never Smoker   . Smokeless tobacco: Not on file  . Alcohol Use: 0.0 oz/week    0 Standard drinks or equivalent per week     Comment: 2 glasses wine- ocass  . Drug Use: No  . Sexual Activity: Not on file   Other Topics Concern  . Not on file   Social History Narrative   Lives with son   Caffeine use: Green tea- ocass     PHYSICAL EXAM   Filed Vitals:   02/26/15 0802  BP: 112/74  Pulse: 58  Height:  (1.676 m)  Weight: 145 lb (65.772 kg)    Not recorded      Body mass index is 23.41 kg/(m^2).  PHYSICAL EXAMNIATION:  Gen: NAD, conversant, well nourised, obese, well groomed  Cardiovascular: Regular rate rhythm, no peripheral edema, warm, nontender. Eyes: Conjunctivae clear without exudates or hemorrhage Neck: Supple, no carotid bruise. Pulmonary: Clear to auscultation bilaterally   NEUROLOGICAL EXAM:  MENTAL STATUS: Speech:    Speech is normal; fluent and spontaneous with normal comprehension.  Cognition:     Orientation to time, place and person     Normal recent and remote memory     Normal Attention span and concentration     Normal Language, naming, repeating,spontaneous speech     Fund of knowledge   CRANIAL NERVES: CN II: Visual fields are full to confrontation. Fundoscopic exam is normal with sharp discs and no vascular changes.  Pupils are round equal and briskly reactive to light. CN III, IV, VI: extraocular movement are normal. No ptosis. CN V: Facial sensation is intact to pinprick in all 3 divisions bilaterally. Corneal responses are intact.  CN VII: Face is symmetric with normal eye closure and smile. CN VIII: Hearing is normal to rubbing fingers CN IX, X: Palate elevates symmetrically. Phonation is normal. CN XI: Head turning and shoulder shrug are intact CN XII: Tongue is midline with normal movements and no atrophy.  MOTOR: She has moderate right shoulder elevation, mild anterocollis, moderate left tilt, moderate right turn, occasionally no-no head shaking, full range of motion There is no pronator drift of out-stretched arms. Muscle bulk and tone are normal. Muscle strength is normal.  REFLEXES: Reflexes are 2+ and symmetric at the biceps, triceps, knees, and ankles. Plantar responses are flexor.  SENSORY: Intact to light touch, pinprick, position sense, and vibration sense are intact in fingers and toes.  COORDINATION: Rapid alternating movements and fine finger movements are intact. There is no dysmetria on finger-to-nose and heel-knee-shin.    GAIT/STANCE: Posture is normal. Gait is steady with normal steps, base, arm swing, and turning. Heel and toe walking are normal. Tandem gait is normal.  Romberg is absent.   DIAGNOSTIC DATA (LABS, IMAGING, TESTING) - I reviewed patient records, labs, notes, testing and imaging myself where available.   ASSESSMENT AND PLAN  Stacy Terry is a 55 y.o. female    Cervical dystonia  She has moderate anterocollis, right shoulder elevation, left tilt, right turn, occasionally no-no head shaking, good range of motion  Complete evaluation with MRI of cervical spine  I have reviewed her diagnosis, mechanism of action of botulism toxin, potential side effect  She will return for EMG guided xeomin injection.  Face to face time was 25 minutes, greater than 50%  of the time was spent in counseling and coordination of care with the patient   Levert Feinstein, M.D. Ph.D.  [facility] [address] [place], [place] [address] Ph: [phone] Fax: [phone]  CC: Dr. Betty Swaziland

## 2015-03-02 ENCOUNTER — Ambulatory Visit: Payer: Federal, State, Local not specified - PPO | Admitting: Neurology

## 2015-03-18 ENCOUNTER — Ambulatory Visit (INDEPENDENT_AMBULATORY_CARE_PROVIDER_SITE_OTHER): Payer: Federal, State, Local not specified - PPO

## 2015-03-18 DIAGNOSIS — G243 Spasmodic torticollis: Secondary | ICD-10-CM | POA: Diagnosis not present

## 2015-12-14 ENCOUNTER — Other Ambulatory Visit: Payer: Self-pay | Admitting: Family Medicine

## 2015-12-14 DIAGNOSIS — Z1231 Encounter for screening mammogram for malignant neoplasm of breast: Secondary | ICD-10-CM

## 2016-08-25 DIAGNOSIS — K080 Exfoliation of teeth due to systemic causes: Secondary | ICD-10-CM | POA: Diagnosis not present

## 2017-05-30 ENCOUNTER — Other Ambulatory Visit: Payer: Self-pay | Admitting: Family Medicine

## 2017-05-30 DIAGNOSIS — Z1231 Encounter for screening mammogram for malignant neoplasm of breast: Secondary | ICD-10-CM

## 2017-05-31 ENCOUNTER — Ambulatory Visit
Admission: RE | Admit: 2017-05-31 | Discharge: 2017-05-31 | Disposition: A | Discharge status: Home / Self Care | Payer: Federal, State, Local not specified - PPO | Source: Ambulatory Visit | Attending: Family Medicine | Admitting: Family Medicine

## 2017-05-31 DIAGNOSIS — Z1231 Encounter for screening mammogram for malignant neoplasm of breast: Secondary | ICD-10-CM

## 2017-09-14 DIAGNOSIS — R7303 Prediabetes: Secondary | ICD-10-CM | POA: Diagnosis not present

## 2017-09-14 DIAGNOSIS — M7662 Achilles tendinitis, left leg: Secondary | ICD-10-CM | POA: Diagnosis not present

## 2017-09-14 DIAGNOSIS — I10 Essential (primary) hypertension: Secondary | ICD-10-CM | POA: Diagnosis not present

## 2017-09-14 DIAGNOSIS — F411 Generalized anxiety disorder: Secondary | ICD-10-CM | POA: Diagnosis not present

## 2017-09-20 DIAGNOSIS — K080 Exfoliation of teeth due to systemic causes: Secondary | ICD-10-CM | POA: Diagnosis not present

## 2017-10-10 DIAGNOSIS — Z01419 Encounter for gynecological examination (general) (routine) without abnormal findings: Secondary | ICD-10-CM | POA: Diagnosis not present

## 2018-03-16 DIAGNOSIS — R7303 Prediabetes: Secondary | ICD-10-CM | POA: Diagnosis not present

## 2018-03-16 DIAGNOSIS — F411 Generalized anxiety disorder: Secondary | ICD-10-CM | POA: Diagnosis not present

## 2018-03-16 DIAGNOSIS — I10 Essential (primary) hypertension: Secondary | ICD-10-CM | POA: Diagnosis not present

## 2018-06-25 DIAGNOSIS — K080 Exfoliation of teeth due to systemic causes: Secondary | ICD-10-CM | POA: Diagnosis not present

## 2018-10-12 DIAGNOSIS — Z01419 Encounter for gynecological examination (general) (routine) without abnormal findings: Secondary | ICD-10-CM | POA: Diagnosis not present

## 2018-10-18 DIAGNOSIS — Z13220 Encounter for screening for lipoid disorders: Secondary | ICD-10-CM | POA: Diagnosis not present

## 2018-10-18 DIAGNOSIS — R7303 Prediabetes: Secondary | ICD-10-CM | POA: Diagnosis not present

## 2018-10-18 DIAGNOSIS — I10 Essential (primary) hypertension: Secondary | ICD-10-CM | POA: Diagnosis not present

## 2018-10-18 DIAGNOSIS — Z0000 Encounter for general adult medical examination without abnormal findings: Secondary | ICD-10-CM | POA: Diagnosis not present

## 2018-10-18 DIAGNOSIS — Z131 Encounter for screening for diabetes mellitus: Secondary | ICD-10-CM | POA: Diagnosis not present

## 2019-04-04 ENCOUNTER — Ambulatory Visit: Payer: Federal, State, Local not specified - PPO | Attending: Internal Medicine

## 2019-04-04 DIAGNOSIS — Z23 Encounter for immunization: Secondary | ICD-10-CM

## 2019-04-04 NOTE — Progress Notes (Signed)
   Covid-19 Vaccination Clinic  Name:  LYRIC ROSSANO    MRN: [id number] DOB: 1960-02-08  04/04/2019  Ms. Melkonian was observed post Covid-19 immunization for 15 minutes without incident. She was provided with Vaccine Information Sheet and instruction to access the V-Safe system.   Ms. Novella was instructed to call 911 with any severe reactions post vaccine: Marland Kitchen Difficulty breathing  . Swelling of face and throat  . A fast heartbeat  . A bad rash all over body  . Dizziness and weakness   Immunizations Administered    Name Date Dose VIS Date Route   Pfizer COVID-19 Vaccine 04/04/2019  9:33 AM 0.3 mL 12/14/2018 Intramuscular   Manufacturer: [employer], [employer]   Lot: [id number]   NDC: [address]-4

## 2019-04-29 ENCOUNTER — Ambulatory Visit: Payer: Federal, State, Local not specified - PPO | Attending: Internal Medicine

## 2019-04-29 DIAGNOSIS — Z23 Encounter for immunization: Secondary | ICD-10-CM

## 2019-04-29 NOTE — Progress Notes (Signed)
   Covid-19 Vaccination Clinic  Name:  AMA MCMASTER    MRN: [id number] DOB: 01-04-1960  04/29/2019  Ms. Shuster was observed post Covid-19 immunization for 30 minutes based on pre-vaccination screening without incident. She was provided with Vaccine Information Sheet and instruction to access the V-Safe system.   Ms. Culliton was instructed to call 911 with any severe reactions post vaccine: Marland Kitchen Difficulty breathing  . Swelling of face and throat  . A fast heartbeat  . A bad rash all over body  . Dizziness and weakness   Immunizations Administered    Name Date Dose VIS Date Route   Pfizer COVID-19 Vaccine 04/29/2019  9:46 AM 0.3 mL 02/27/2018 Intramuscular   Manufacturer: [employer], [employer]   Lot: [id number]   NDC: [address]-6

## 2019-08-12 DIAGNOSIS — Z20822 Contact with and (suspected) exposure to covid-19: Secondary | ICD-10-CM | POA: Diagnosis not present

## 2019-08-16 DIAGNOSIS — Z9189 Other specified personal risk factors, not elsewhere classified: Secondary | ICD-10-CM | POA: Diagnosis not present

## 2019-08-16 DIAGNOSIS — U071 COVID-19: Secondary | ICD-10-CM | POA: Diagnosis not present

## 2019-08-23 DIAGNOSIS — R0602 Shortness of breath: Secondary | ICD-10-CM | POA: Diagnosis not present

## 2019-08-23 DIAGNOSIS — Z03818 Encounter for observation for suspected exposure to other biological agents ruled out: Secondary | ICD-10-CM | POA: Diagnosis not present

## 2019-08-23 DIAGNOSIS — U071 COVID-19: Secondary | ICD-10-CM | POA: Diagnosis not present

## 2019-08-23 DIAGNOSIS — R9431 Abnormal electrocardiogram [ECG] [EKG]: Secondary | ICD-10-CM | POA: Diagnosis not present

## 2019-10-12 DIAGNOSIS — U071 COVID-19: Secondary | ICD-10-CM | POA: Diagnosis not present

## 2019-10-12 DIAGNOSIS — R059 Cough, unspecified: Secondary | ICD-10-CM | POA: Diagnosis not present

## 2019-10-18 DIAGNOSIS — U071 COVID-19: Secondary | ICD-10-CM | POA: Diagnosis not present

## 2019-10-18 DIAGNOSIS — Z9189 Other specified personal risk factors, not elsewhere classified: Secondary | ICD-10-CM | POA: Diagnosis not present

## 2019-10-21 DIAGNOSIS — R0602 Shortness of breath: Secondary | ICD-10-CM | POA: Diagnosis not present

## 2019-10-21 DIAGNOSIS — Z03818 Encounter for observation for suspected exposure to other biological agents ruled out: Secondary | ICD-10-CM | POA: Diagnosis not present

## 2019-10-21 DIAGNOSIS — R942 Abnormal results of pulmonary function studies: Secondary | ICD-10-CM | POA: Diagnosis not present

## 2019-10-21 DIAGNOSIS — U071 COVID-19: Secondary | ICD-10-CM | POA: Diagnosis not present

## 2019-10-21 DIAGNOSIS — R9431 Abnormal electrocardiogram [ECG] [EKG]: Secondary | ICD-10-CM | POA: Diagnosis not present

## 2019-10-22 DIAGNOSIS — R942 Abnormal results of pulmonary function studies: Secondary | ICD-10-CM | POA: Diagnosis not present

## 2019-10-22 DIAGNOSIS — U071 COVID-19: Secondary | ICD-10-CM | POA: Diagnosis not present

## 2019-10-22 DIAGNOSIS — U099 Post covid-19 condition, unspecified: Secondary | ICD-10-CM | POA: Diagnosis not present

## 2019-10-22 DIAGNOSIS — R0602 Shortness of breath: Secondary | ICD-10-CM | POA: Diagnosis not present

## 2019-11-29 DIAGNOSIS — R9431 Abnormal electrocardiogram [ECG] [EKG]: Secondary | ICD-10-CM | POA: Diagnosis not present

## 2019-12-31 DIAGNOSIS — U071 COVID-19: Secondary | ICD-10-CM | POA: Diagnosis not present

## 2020-02-11 DIAGNOSIS — F411 Generalized anxiety disorder: Secondary | ICD-10-CM | POA: Diagnosis not present

## 2020-02-11 DIAGNOSIS — I10 Essential (primary) hypertension: Secondary | ICD-10-CM | POA: Diagnosis not present

## 2020-02-21 DIAGNOSIS — Z20822 Contact with and (suspected) exposure to covid-19: Secondary | ICD-10-CM | POA: Diagnosis not present

## 2020-02-21 DIAGNOSIS — Z03818 Encounter for observation for suspected exposure to other biological agents ruled out: Secondary | ICD-10-CM | POA: Diagnosis not present

## 2020-07-31 ENCOUNTER — Other Ambulatory Visit: Payer: Self-pay | Admitting: Family Medicine

## 2020-07-31 ENCOUNTER — Other Ambulatory Visit: Payer: Self-pay | Admitting: Nurse Practitioner

## 2020-07-31 DIAGNOSIS — Z1231 Encounter for screening mammogram for malignant neoplasm of breast: Secondary | ICD-10-CM

## 2020-09-21 ENCOUNTER — Other Ambulatory Visit: Payer: Self-pay

## 2020-09-21 ENCOUNTER — Ambulatory Visit
Admission: RE | Admit: 2020-09-21 | Discharge: 2020-09-21 | Disposition: A | Discharge status: Home / Self Care | Payer: Federal, State, Local not specified - PPO | Source: Ambulatory Visit | Attending: Nurse Practitioner | Admitting: Nurse Practitioner

## 2020-09-21 DIAGNOSIS — Z1231 Encounter for screening mammogram for malignant neoplasm of breast: Secondary | ICD-10-CM | POA: Diagnosis not present

## 2020-09-29 DIAGNOSIS — R7303 Prediabetes: Secondary | ICD-10-CM | POA: Diagnosis not present

## 2020-09-29 DIAGNOSIS — I10 Essential (primary) hypertension: Secondary | ICD-10-CM | POA: Diagnosis not present

## 2020-09-29 DIAGNOSIS — Z0000 Encounter for general adult medical examination without abnormal findings: Secondary | ICD-10-CM | POA: Diagnosis not present

## 2020-09-29 DIAGNOSIS — Z13220 Encounter for screening for lipoid disorders: Secondary | ICD-10-CM | POA: Diagnosis not present

## 2020-09-29 DIAGNOSIS — Z23 Encounter for immunization: Secondary | ICD-10-CM | POA: Diagnosis not present

## 2021-02-05 DIAGNOSIS — R7303 Prediabetes: Secondary | ICD-10-CM | POA: Diagnosis not present

## 2021-02-05 DIAGNOSIS — I10 Essential (primary) hypertension: Secondary | ICD-10-CM | POA: Diagnosis not present

## 2021-05-04 DIAGNOSIS — R7303 Prediabetes: Secondary | ICD-10-CM | POA: Diagnosis not present

## 2021-09-01 DIAGNOSIS — F411 Generalized anxiety disorder: Secondary | ICD-10-CM | POA: Diagnosis not present

## 2021-09-29 IMAGING — MG MM DIGITAL SCREENING BILAT W/ TOMO AND CAD
6 of 12 series · 6 of 36 positions shown · non-contrast
Comparison: Previous exam(s).

CLINICAL DATA: Screening.

EXAM:
DIGITAL SCREENING BILATERAL MAMMOGRAM WITH TOMOSYNTHESIS AND CAD
TECHNIQUE: Bilateral screening digital craniocaudal and mediolateral oblique
mammograms were obtained. Bilateral screening digital breast
tomosynthesis was performed. The images were evaluated with
computer-aided detection.

[L MLO synth-2D (1 of 2)]
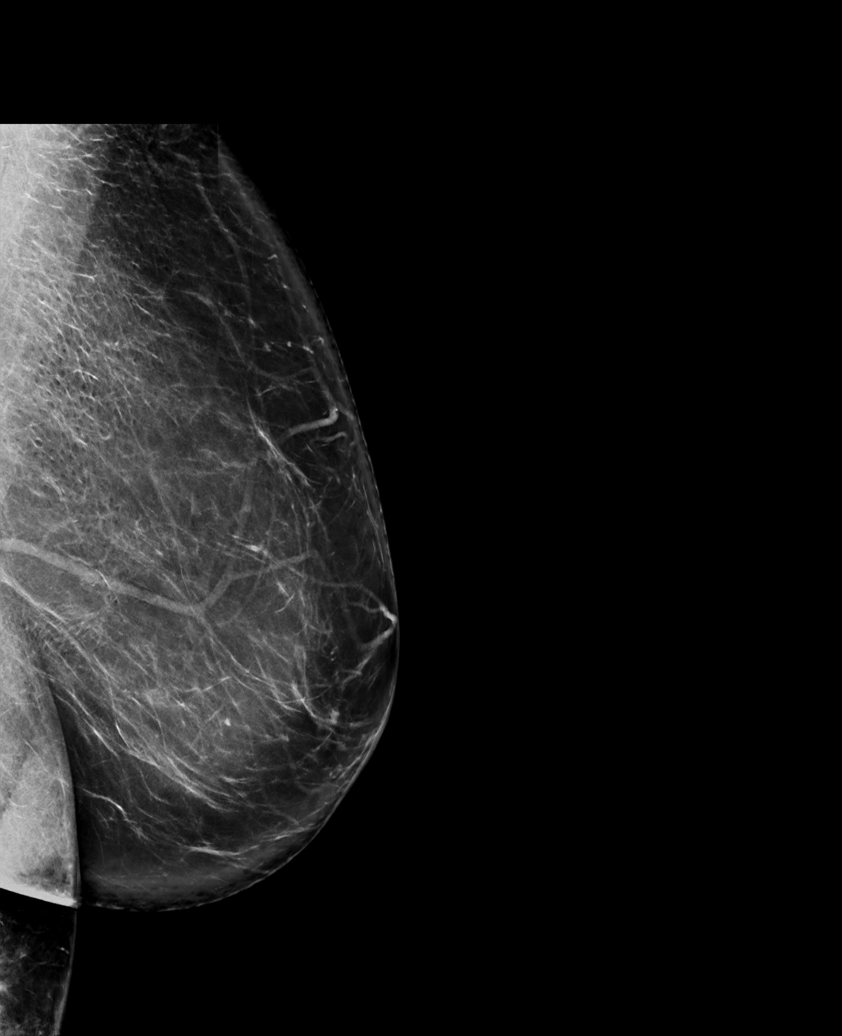

[R MLO synth-2D (1 of 2)]
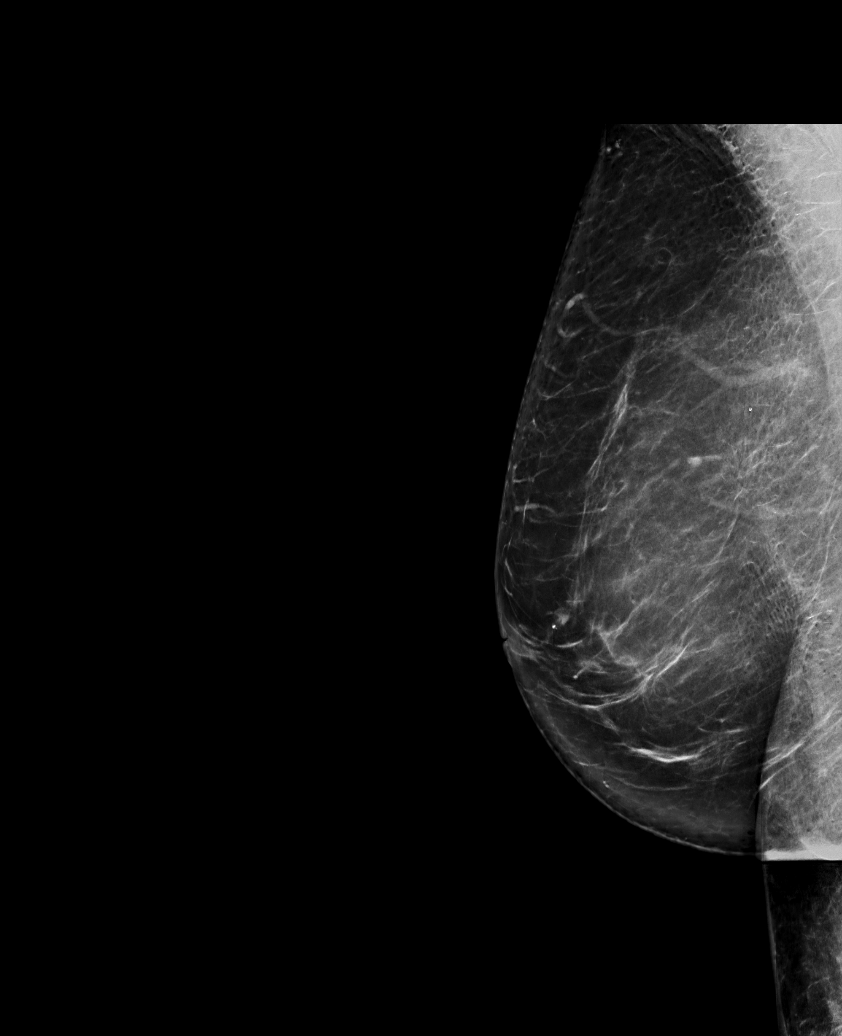

[L CC synth-2D]
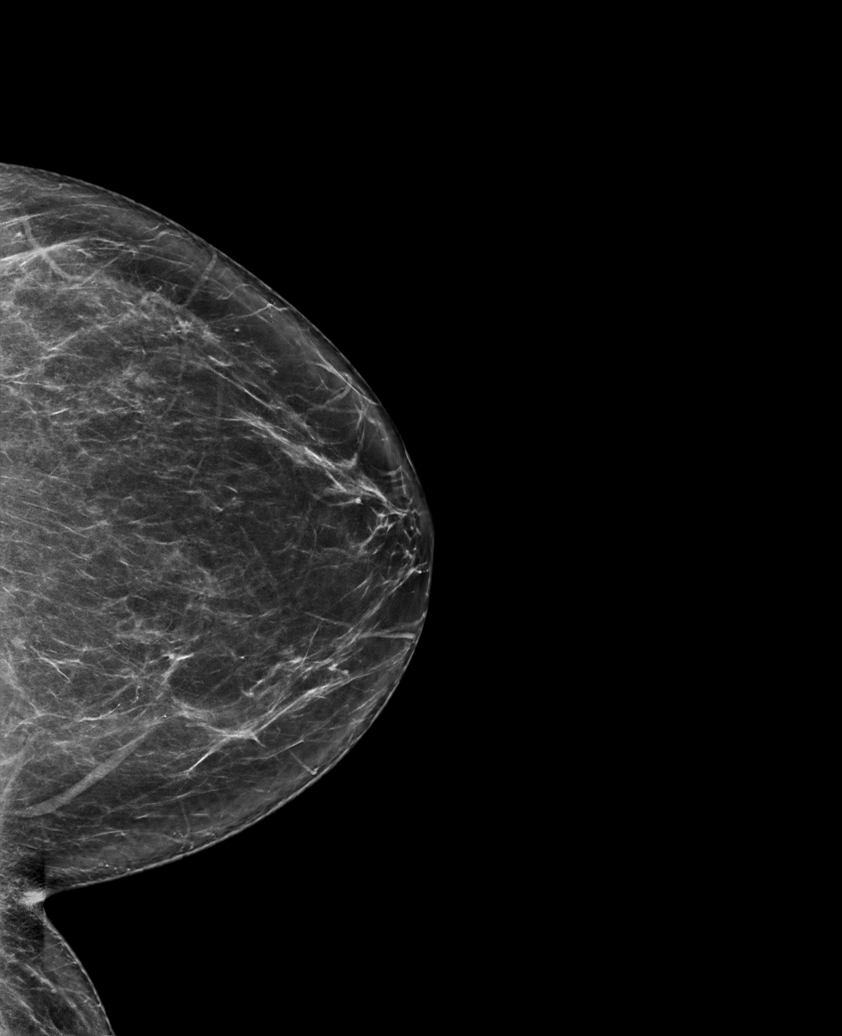

[R CC synth-2D]
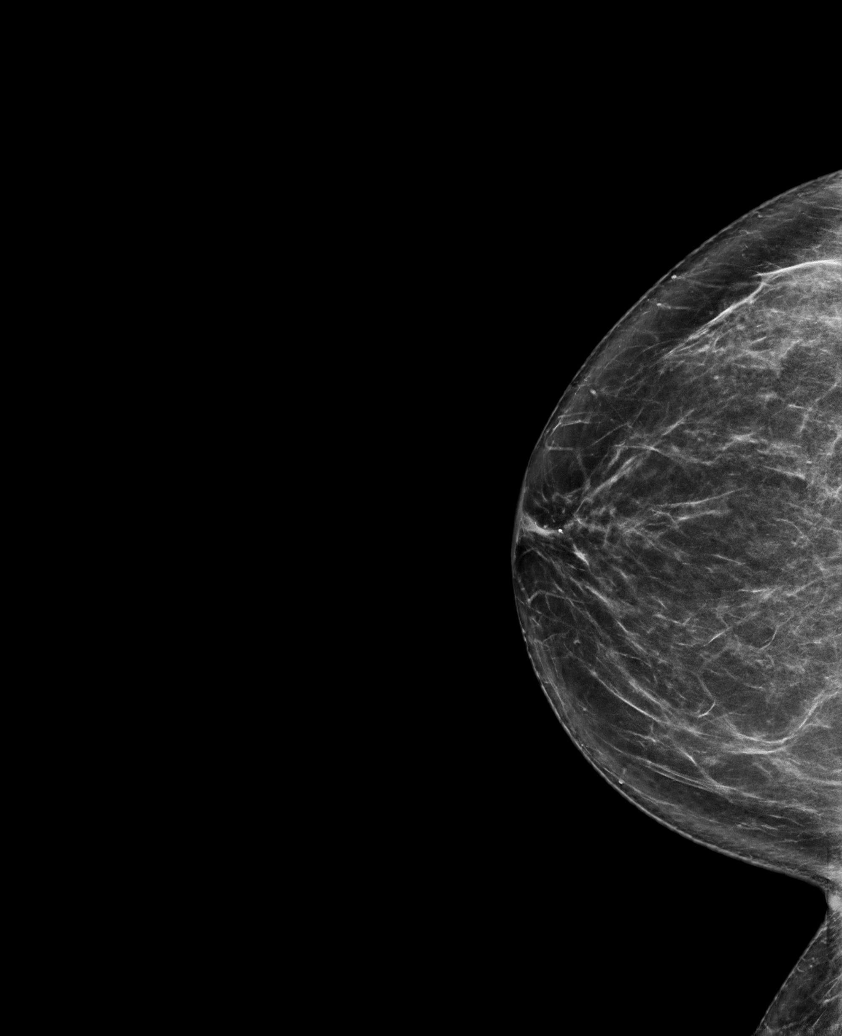

[L MLO synth-2D (2 of 2)]
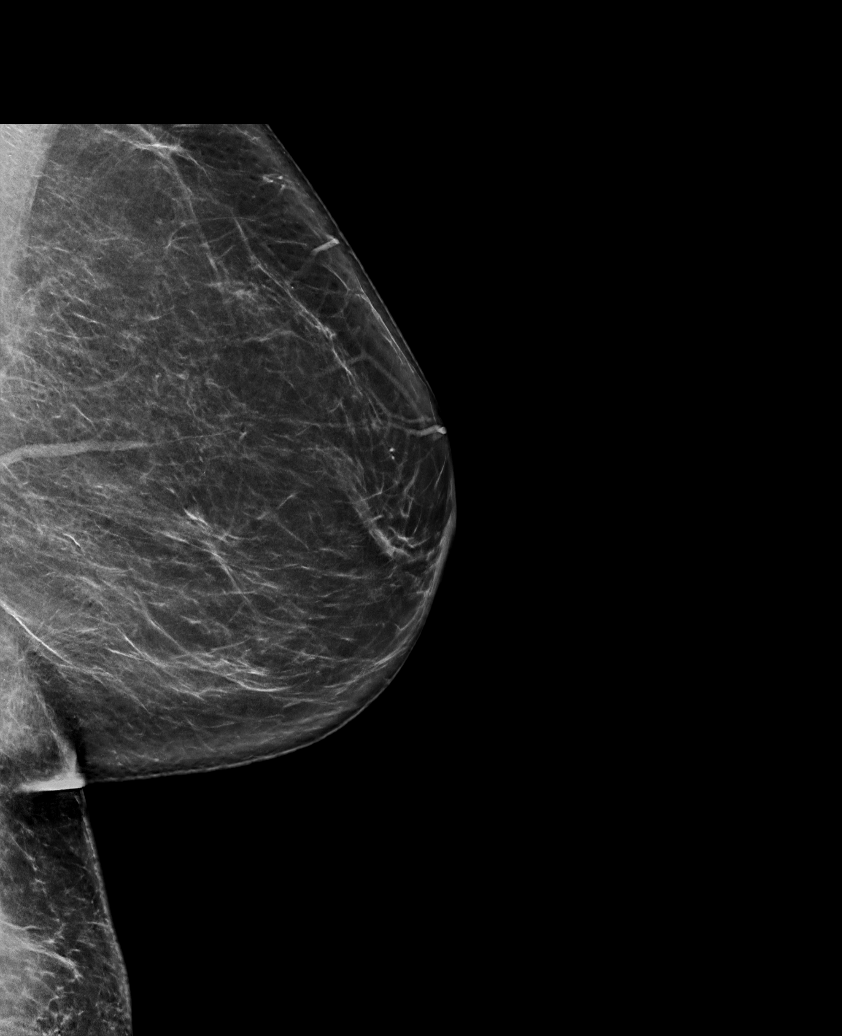

[R MLO synth-2D (2 of 2)]
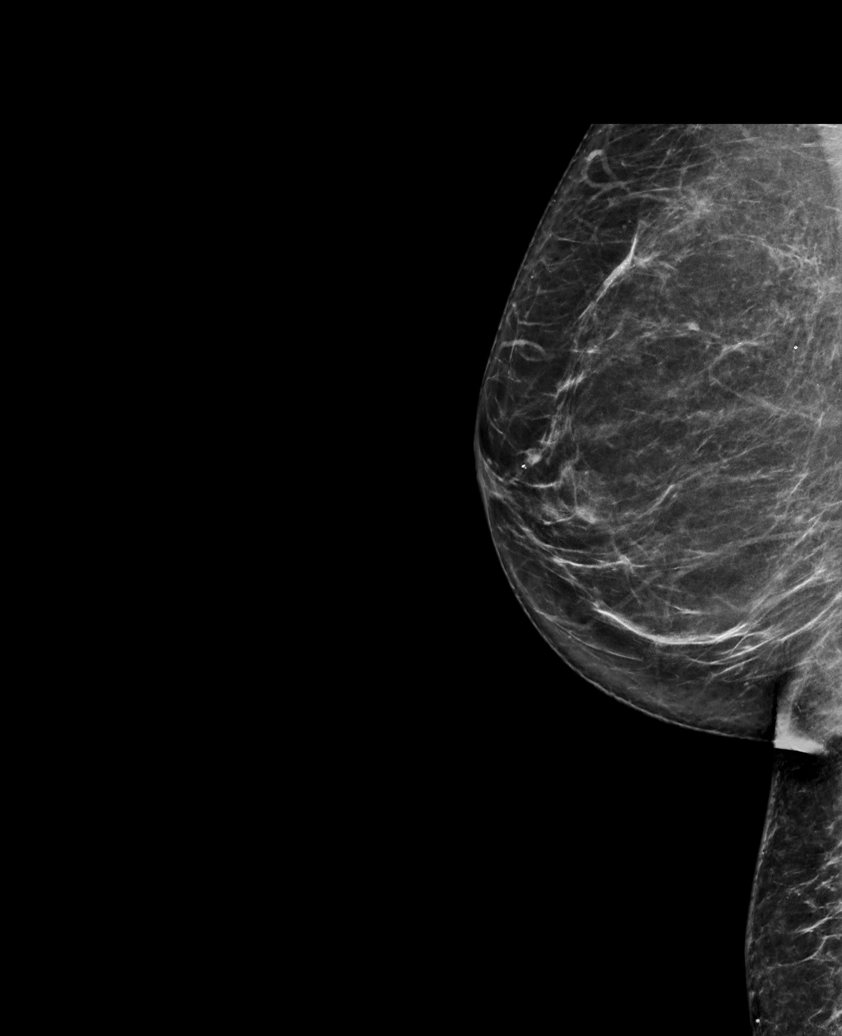

[6 of 36 positions shown; findings below may reference images not displayed]

ACR Breast Density Category b: There are scattered areas of
fibroglandular density.
FINDINGS: There are no findings suspicious for malignancy.
IMPRESSION: No mammographic evidence of malignancy. A result letter of this
screening mammogram will be mailed directly to the patient.

RECOMMENDATION:
Screening mammogram in one year. (Code:51-O-LD2)

BI-RADS CATEGORY  1: Negative.

## 2021-10-12 ENCOUNTER — Other Ambulatory Visit: Payer: Self-pay | Admitting: Family Medicine

## 2021-10-12 DIAGNOSIS — Z1231 Encounter for screening mammogram for malignant neoplasm of breast: Secondary | ICD-10-CM

## 2021-10-19 ENCOUNTER — Ambulatory Visit: Payer: Federal, State, Local not specified - PPO

## 2021-11-04 ENCOUNTER — Ambulatory Visit: Payer: Federal, State, Local not specified - PPO

## 2021-11-17 ENCOUNTER — Ambulatory Visit
Admission: RE | Admit: 2021-11-17 | Discharge: 2021-11-17 | Disposition: A | Discharge status: Home / Self Care | Payer: Federal, State, Local not specified - PPO | Source: Ambulatory Visit | Attending: Family Medicine | Admitting: Family Medicine

## 2021-11-17 DIAGNOSIS — Z1231 Encounter for screening mammogram for malignant neoplasm of breast: Secondary | ICD-10-CM

## 2022-03-09 DIAGNOSIS — F411 Generalized anxiety disorder: Secondary | ICD-10-CM | POA: Diagnosis not present

## 2022-03-09 DIAGNOSIS — I10 Essential (primary) hypertension: Secondary | ICD-10-CM | POA: Diagnosis not present

## 2022-03-14 DIAGNOSIS — I10 Essential (primary) hypertension: Secondary | ICD-10-CM | POA: Diagnosis not present

## 2022-08-24 DIAGNOSIS — K080 Exfoliation of teeth due to systemic causes: Secondary | ICD-10-CM | POA: Diagnosis not present

## 2022-10-12 DIAGNOSIS — Z01419 Encounter for gynecological examination (general) (routine) without abnormal findings: Secondary | ICD-10-CM | POA: Diagnosis not present

## 2022-10-12 DIAGNOSIS — Z124 Encounter for screening for malignant neoplasm of cervix: Secondary | ICD-10-CM | POA: Diagnosis not present

## 2022-10-12 DIAGNOSIS — Z0000 Encounter for general adult medical examination without abnormal findings: Secondary | ICD-10-CM | POA: Diagnosis not present

## 2022-10-13 ENCOUNTER — Encounter: Payer: Self-pay | Admitting: Family Medicine

## 2022-11-10 ENCOUNTER — Other Ambulatory Visit: Payer: Self-pay | Admitting: Family Medicine

## 2022-11-10 DIAGNOSIS — Z1231 Encounter for screening mammogram for malignant neoplasm of breast: Secondary | ICD-10-CM

## 2022-12-20 ENCOUNTER — Ambulatory Visit
Admission: RE | Admit: 2022-12-20 | Discharge: 2022-12-20 | Disposition: A | Discharge status: Home / Self Care | Payer: Federal, State, Local not specified - PPO | Source: Ambulatory Visit | Attending: Family Medicine | Admitting: Family Medicine

## 2022-12-20 DIAGNOSIS — Z1231 Encounter for screening mammogram for malignant neoplasm of breast: Secondary | ICD-10-CM

## 2023-08-02 ENCOUNTER — Other Ambulatory Visit: Payer: Self-pay | Admitting: Family Medicine

## 2023-08-02 DIAGNOSIS — M25561 Pain in right knee: Secondary | ICD-10-CM

## 2023-08-02 DIAGNOSIS — M2391 Unspecified internal derangement of right knee: Secondary | ICD-10-CM

## 2023-08-06 ENCOUNTER — Ambulatory Visit (INDEPENDENT_AMBULATORY_CARE_PROVIDER_SITE_OTHER)

## 2023-08-06 DIAGNOSIS — M25561 Pain in right knee: Secondary | ICD-10-CM

## 2023-08-06 DIAGNOSIS — M2391 Unspecified internal derangement of right knee: Secondary | ICD-10-CM

## 2023-11-28 ENCOUNTER — Other Ambulatory Visit: Payer: Self-pay

## 2023-11-28 DIAGNOSIS — Z780 Asymptomatic menopausal state: Secondary | ICD-10-CM

## 2023-11-28 DIAGNOSIS — Z13820 Encounter for screening for osteoporosis: Secondary | ICD-10-CM

## 2024-02-07 ENCOUNTER — Ambulatory Visit

## 2024-02-07 DIAGNOSIS — Z780 Asymptomatic menopausal state: Secondary | ICD-10-CM

## 2024-02-07 DIAGNOSIS — Z13820 Encounter for screening for osteoporosis: Secondary | ICD-10-CM
